# Patient Record
Sex: Female | Born: 1995 | Race: White | Hispanic: No | Marital: Single | Smoking: Never smoker
Health system: Southern US, Community
[De-identification: ages and names within clinical notes are randomized; demographics above are authoritative.]

## PROBLEM LIST (undated history)

## (undated) DIAGNOSIS — J45909 Unspecified asthma, uncomplicated: Secondary | ICD-10-CM

## (undated) DIAGNOSIS — L709 Acne, unspecified: Secondary | ICD-10-CM

## (undated) DIAGNOSIS — F909 Attention-deficit hyperactivity disorder, unspecified type: Secondary | ICD-10-CM

## (undated) DIAGNOSIS — J309 Allergic rhinitis, unspecified: Secondary | ICD-10-CM

## (undated) HISTORY — PX: NO PAST SURGERIES: SHX2092

## (undated) HISTORY — DX: Allergic rhinitis, unspecified: J30.9

---
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
## 2015-03-17 DIAGNOSIS — T148XXA Other injury of unspecified body region, initial encounter: Secondary | ICD-10-CM | POA: Insufficient documentation

## 2015-06-14 DIAGNOSIS — A048 Other specified bacterial intestinal infections: Secondary | ICD-10-CM | POA: Insufficient documentation

## 2017-01-02 DIAGNOSIS — F330 Major depressive disorder, recurrent, mild: Secondary | ICD-10-CM | POA: Insufficient documentation

## 2017-01-02 DIAGNOSIS — F259 Schizoaffective disorder, unspecified: Secondary | ICD-10-CM | POA: Insufficient documentation

## 2017-03-03 DIAGNOSIS — R45851 Suicidal ideations: Secondary | ICD-10-CM | POA: Insufficient documentation

## 2017-03-04 ENCOUNTER — Ambulatory Visit: Payer: Self-pay | Admitting: Allergy and Immunology

## 2017-05-12 DIAGNOSIS — F411 Generalized anxiety disorder: Secondary | ICD-10-CM | POA: Insufficient documentation

## 2017-11-11 ENCOUNTER — Ambulatory Visit (INDEPENDENT_AMBULATORY_CARE_PROVIDER_SITE_OTHER): Payer: PRIVATE HEALTH INSURANCE | Admitting: Pediatrics

## 2017-11-11 ENCOUNTER — Encounter: Payer: Self-pay | Admitting: Pediatrics

## 2017-11-11 VITALS — BP 120/62 | HR 106 | Temp 98.4°F | Resp 20 | Ht 62.4 in | Wt 149.6 lb

## 2017-11-11 DIAGNOSIS — I7300 Raynaud's syndrome without gangrene: Secondary | ICD-10-CM

## 2017-11-11 DIAGNOSIS — D849 Immunodeficiency, unspecified: Secondary | ICD-10-CM | POA: Diagnosis not present

## 2017-11-11 DIAGNOSIS — J4530 Mild persistent asthma, uncomplicated: Secondary | ICD-10-CM

## 2017-11-11 DIAGNOSIS — Z8619 Personal history of other infectious and parasitic diseases: Secondary | ICD-10-CM | POA: Diagnosis not present

## 2017-11-11 DIAGNOSIS — J3089 Other allergic rhinitis: Secondary | ICD-10-CM | POA: Diagnosis not present

## 2017-11-11 DIAGNOSIS — K219 Gastro-esophageal reflux disease without esophagitis: Secondary | ICD-10-CM

## 2017-11-11 MED ORDER — MONTELUKAST SODIUM 10 MG PO TABS: 10.0000 mg | ORAL_TABLET | Freq: Every day | ORAL | 5 refills | Status: AC

## 2017-11-11 MED ORDER — ALBUTEROL SULFATE (2.5 MG/3ML) 0.083% IN NEBU: 2.5000 mg | INHALATION_SOLUTION | RESPIRATORY_TRACT | 1 refills | Status: AC | PRN

## 2017-11-11 MED ORDER — FLUTICASONE PROPIONATE 50 MCG/ACT NA SUSP: NASAL | 5 refills | Status: AC

## 2017-11-11 NOTE — Progress Notes (Signed)
[address] [place] [address] Dept: [phone]  New Patient Note  Patient ID: Tamara Mayer, female    DOB: 1995/11/26  Age: 22 y.o. MRN: [id number] Date of Office Visit: 11/11/2017 Referring provider: Tomi Likens, MD [address] [place], [place] [address]    Chief Complaint: Allergic Reaction (KEFLEX, SULFA, PCN,AZITHROMYCIN, DOXYCYCLINE, TETRACYCLINES, MINOCYCLINE CAUSED VOMITING AND ITCHING.); Allergic Rhinitis ; and Sinusitis  HPI Tamara Mayer presents for evaluation of adverse drug reactions..  She developed H. pylori infection 2 years ago and was treated with triple therapy.  Since then she has been on Nexium 40 mg twice a day.  She has had more than 4 sinus infections per year in the past few years and has needed antibiotics.  She has had adverse reactions to the antibiotics.  She has had hives from amoxicillin.  She has had vomiting from Keflex and sulfa.  Zithromax made her feel disoriented.  Doxycycline made her vomit but she can tolerate enteric-coated doxycycline.  She did tolerate Levaquin.. She can tolerate Omnicef for a few days but then developed abdominal pain but no itching  She developed asthma at 22 years of age and is on montelukast 10 mg once a day.  She can not coordinate inhalers well and uses albuterol 0.083% 1 unit dose every 4 hours when her symptoms are not well controlled.  She has had more than 4 sinus infections per year  since middle school.  She has aggravation of her symptoms on exposure to dust , the fall of the year and  the winter months.  She has not had an ENT evaluation of her sinuses.  Her last sinus infection was treated with prednisone.  She does not have a history of eczema.  She has never had pneumonia.  She takes Nexium 40 mg twice a day so doxycycline would not be a good choice for a sinus infection.  She has had Raynaud's phenomenon since early childhood  Review of Systems  Constitutional: Negative.   HENT:       Frequent sinus  infections since the teen years  Eyes: Negative.   Respiratory:       Asthma since 22 years of age  Cardiovascular: Negative.   Gastrointestinal:       Gastroesophageal reflux, IBS.  H. pylori infection 2 years ago that had to be treated for a year  Genitourinary: Negative.   Musculoskeletal:       Raynaud's phenomena  Skin:       Acne.  Hives from penicillin and amoxicillin  Neurological: Negative.   Endo/Heme/Allergies:       No diabetes or thyroid disease  Psychiatric/Behavioral:       ADD .  Anxiety.  Depression    Outpatient Encounter Medications as of 11/11/2017  Medication Sig  . albuterol (PROVENTIL) (2.5 MG/3ML) 0.083% nebulizer solution Take 3 mLs (2.5 mg total) by nebulization every 4 (four) hours as needed for wheezing or shortness of breath.  Marland Kitchen buPROPion (WELLBUTRIN XL) 150 MG 24 hr tablet Take by mouth.  . busPIRone (BUSPAR) 10 MG tablet Take 5 mg by mouth 2 (two) times daily.  . clonazePAM (KLONOPIN) 0.5 MG tablet Take 1 tablet by mouth at bedtime.  . dicyclomine (BENTYL) 20 MG tablet TAKE 1 TABLET BY MOUTH 2 TIMES A DAY  . esomeprazole (NEXIUM) 20 MG capsule Take 40 mg by mouth 2 (two) times daily.  . fluticasone (FLONASE) 50 MCG/ACT nasal spray Two sprays each nostril at night for  nasal congestion or drainage.  Marland Kitchen PREVIFEM 0.25-35 MG-MCG tablet   . PROAIR HFA 108 (90 Base) MCG/ACT inhaler   . [DISCONTINUED] albuterol (PROVENTIL) (2.5 MG/3ML) 0.083% nebulizer solution Take 2.5 mg by nebulization every 4 (four) hours as needed for wheezing or shortness of breath.  . [DISCONTINUED] esomeprazole (NEXIUM) 40 MG capsule Take by mouth.  . [DISCONTINUED] fluticasone (FLONASE) 50 MCG/ACT nasal spray Place into the nose.  . montelukast (SINGULAIR) 10 MG tablet Take 10 mg by mouth at bedtime.  . montelukast (SINGULAIR) 10 MG tablet Take 1 tablet (10 mg total) by mouth at bedtime.  . [DISCONTINUED] clarithromycin (BIAXIN) 500 MG tablet Take 500 mg by mouth 2 (two) times daily.    . [DISCONTINUED] ibuprofen (ADVIL,MOTRIN) 600 MG tablet Take 1 tablet (600 mg total) by mouth every 8 (eight) hours as needed for pain.  . [DISCONTINUED] lisdexamfetamine (VYVANSE) 50 MG capsule Take 50 mg by mouth every morning.  . [DISCONTINUED] oxyCODONE-acetaminophen (PERCOCET/ROXICET) 5-325 MG per tablet Take 1 tablet by mouth every 4 (four) hours as needed for pain.   No facility-administered encounter medications on file as of 11/11/2017.      Drug Allergies:  Allergies  Allergen Reactions  . Amoxicillin Other (See Comments) and Nausea And Vomiting  . Azithromycin Other (See Comments)    Pt says she didn't feel like herself. PCP told her she was allergic. Pt says she didn't feel like herself. PCP told her she was allergic. Pt says she didn't feel like herself. PCP told her she was allergic.   Marland Kitchen Doxycycline Nausea And Vomiting    Other reaction(s): GI Upset (intolerance)  . Keflex [Cephalexin] Nausea And Vomiting  . Sulfa Antibiotics Nausea And Vomiting  . Vyvanse [Lisdexamfetamine Dimesylate] Other (See Comments)    Psychotic episodes of seeing people    Family History: Tamara Mayer's family history includes Allergic rhinitis in her brother..  Family history is unknown.  She was adopted.  Social and environmental was adopted.  They have a labradoodle and a  goldendoodle at home.  She is not exposed to cigarette smoking unless she goes to her mother's boyfriend's house.  She has not smoked cigarettes in the past..  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  HENT:  Eyes normal .  Ears normal.  Nose mild swelling of nasal turbinates.  Pharynx normal.   Neck: Neck supple. No thyromegaly present.  Cardiovascular:  S1-S2 normal no murmurs  Pulmonary/Chest:  Clear to percussion and auscultation  Abdominal: Soft. There is no tenderness.  No hepatosplenomegaly  Lymphadenopathy:    She has no cervical adenopathy.  Vitals reviewed.   Diagnostics: FVC 3.28 L FEV1 3.00 L.  Addicted FVC 3.52 L predicted FEV1 3.09 L.  After albuterol 2 puffs FVC 3.68 L FEV1 3.20 L- the spirometry is in the normal range and there was no significant improvement after albuterol  Allergy skin test showed slight reactivity to some molds on intradermal testing only   Assessment  Assessment and Plan: 1. Immunodeficiency (HCC)   2. Mild persistent asthma without complication   3. Other allergic  rhinitis   4. History of Helicobacter pylori infection   5. Gastroesophageal reflux disease without esophagitis   6. Raynaud's phenomenon without gangrene     Meds ordered this encounter  Medications  . fluticasone (FLONASE) 50 MCG/ACT nasal spray    Sig: Two sprays each nostril at night for nasal congestion or drainage.    Dispense:  16 g    Refill:  5    Keep rx on file.  Patient will call for fill.  . montelukast (SINGULAIR) 10 MG tablet    Sig: Take 1 tablet (10 mg total) by mouth at bedtime.    Dispense:  30 tablet    Refill:  5  . albuterol (PROVENTIL) (2.5 MG/3ML) 0.083% nebulizer solution    Sig: Take 3 mLs (2.5 mg total) by nebulization every 4 (four) hours as needed for wheezing or shortness of breath.    Dispense:  75 mL    Refill:  1    Patient Instructions  Environmental control of dust and mold Claritin 10 mg-take 1 tablet at night for runny nose Nasal saline irrigations at night followed by fluticasone 2 sprays per nostril at night Montelukast 10 mg-take 1 tablet once a day to prevent coughing or wheezing Pro-air 2 puffs every 4 hours if needed for coughing or wheezing.  You may use Pro-air 2 puffs 5 to 15 minutes before exercise Instead of  Pro-air , you may use albuterol 0.083% 1 unit dose every 4 hours if needed I will call you with the results of your blood work looking at recurrent infections Find out if your insurance would pay for VSL#3 -  450 packet as a once a day probiotic.  With many antibiotics, you tolerate them for a few days and then develop nausea which could be related to a change in the bacteria in your intestines I recommend that you see an ENT specialist to evaluate the recurrent sinus infections with a sinus endoscopy.  She was given the name of Richardson Landry I will call you with the name of the antibiotic used to treat her H. pylori infection in the event that we can use it for a  sinus infection.  She took metronidazole and  tetracycline For a sinus infection my first choice would be to use Cefdinir with a probiotic. She  tolerated Levaquin but I would not use it as a first-line Continue with her other medications, including Nexium 40 mg twice a day      Return in about 6 weeks (around 12/23/2017).   Thank you for the opportunity to care for this patient.  Please do not hesitate to contact me with questions.  Tonette Bihari, M.D.  [facility] [address] [place], [place] [address] [phone]

## 2017-11-11 NOTE — Patient Instructions (Addendum)
Environmental control of dust and mold Claritin 10 mg-take 1 tablet at night for runny nose Nasal saline irrigations at night followed by fluticasone 2 sprays per nostril at night Montelukast 10 mg-take 1 tablet once a day to prevent coughing or wheezing Pro-air 2 puffs every 4 hours if needed for coughing or wheezing.  You may use Pro-air 2 puffs 5 to 15 minutes before exercise Instead of Pro-air , you may use albuterol 0.083% 1 unit dose every 4 hours if needed I will call you with the results of your blood work looking at recurrent infections Find out if your insurance would pay for VSL#3 -  450 packet as a once a day probiotic.  With many antibiotics, you tolerate them for a few days and then develop nausea which could be related to a change in the bacteria in your intestines I recommend that you see an ENT specialist to evaluate the recurrent sinus infections with a sinus endoscopy.  She was given the name of Richardson Landry I will call you with the name of the antibiotic used to treat her H. pylori infection in the event that we can use it for a  sinus infection.  She took metronidazole and  tetracycline For a sinus infection my first choice would be to use Cefdinir with a probiotic. She  tolerated Levaquin but I would not use it as a first-line Continue with her other medications, including Nexium 40 mg twice a day

## 2017-11-15 LAB — T + B-LYMPHOCYTE DIFFERENTIAL
% CD 3 POS. LYMPH.: 79 % (ref 57.5–86.2)
% CD 4 Pos. Lymph.: 50 % (ref 30.8–58.5)
ABSOLUTE CD3: 1343 /uL (ref 622–2402)
Absolute CD 4 Helper: 850 /uL (ref 359–1519)
BASOS: 1 %
Basophils Absolute: 0 10*3/uL (ref 0.0–0.2)
CD19 % B Cell: 13.4 % (ref 3.3–25.4)
CD19 ABS: 228 /uL (ref 12–645)
CD4/CD8 Ratio: 2 (ref 0.92–3.72)
CD8 % Suppressor T Cell: 25 % (ref 12.0–35.5)
CD8 T Cell Abs: 425 /uL (ref 109–897)
EOS (ABSOLUTE): 0.1 10*3/uL (ref 0.0–0.4)
Eos: 1 %
Hematocrit: 39.7 % (ref 34.0–46.6)
Hemoglobin: 12.7 g/dL (ref 11.1–15.9)
IMMATURE GRANS (ABS): 0 10*3/uL (ref 0.0–0.1)
IMMATURE GRANULOCYTES: 0 %
LYMPHS: 32 %
Lymphocytes Absolute: 1.7 10*3/uL (ref 0.7–3.1)
MCH: 27.6 pg (ref 26.6–33.0)
MCHC: 32 g/dL (ref 31.5–35.7)
MCV: 86 fL (ref 79–97)
Monocytes Absolute: 0.4 10*3/uL (ref 0.1–0.9)
Monocytes: 8 %
Neutrophils Absolute: 3 10*3/uL (ref 1.4–7.0)
Neutrophils: 58 %
Platelets: 290 10*3/uL (ref 150–450)
RBC: 4.6 x10E6/uL (ref 3.77–5.28)
RDW: 12 % — AB (ref 12.3–15.4)
WBC: 5.3 10*3/uL (ref 3.4–10.8)

## 2017-11-15 LAB — IGG, IGA, IGM
IGA/IMMUNOGLOBULIN A, SERUM: 205 mg/dL (ref 87–352)
IGG (IMMUNOGLOBIN G), SERUM: 1356 mg/dL (ref 700–1600)
IgM (Immunoglobulin M), Srm: 71 mg/dL (ref 26–217)

## 2017-11-15 LAB — IGE: IgE (Immunoglobulin E), Serum: 13 IU/mL (ref 6–495)

## 2017-11-15 LAB — ANA W/REFLEX IF POSITIVE: Anti Nuclear Antibody(ANA): NEGATIVE

## 2017-11-20 ENCOUNTER — Encounter: Payer: Self-pay | Admitting: *Deleted

## 2017-12-11 ENCOUNTER — Ambulatory Visit: Payer: PRIVATE HEALTH INSURANCE | Admitting: Family Medicine

## 2017-12-11 DIAGNOSIS — J309 Allergic rhinitis, unspecified: Secondary | ICD-10-CM

## 2017-12-22 ENCOUNTER — Encounter: Payer: Self-pay | Admitting: Gastroenterology
